# Patient Record
Sex: Female | Born: 1995 | Race: Black or African American | Hispanic: No | Marital: Single | Smoking: Former smoker
Health system: Southern US, Community
[De-identification: ages and names within clinical notes are randomized; demographics above are authoritative.]

## PROBLEM LIST (undated history)

## (undated) DIAGNOSIS — J45909 Unspecified asthma, uncomplicated: Secondary | ICD-10-CM

---

## 2020-01-10 ENCOUNTER — Emergency Department
Admission: EM | Admit: 2020-01-10 | Discharge: 2020-01-10 | Disposition: A | Discharge status: Home / Self Care | Payer: No Typology Code available for payment source | Attending: Emergency Medicine | Admitting: Emergency Medicine

## 2020-01-10 ENCOUNTER — Emergency Department: Payer: No Typology Code available for payment source

## 2020-01-10 ENCOUNTER — Other Ambulatory Visit: Payer: Self-pay

## 2020-01-10 DIAGNOSIS — S0990XA Unspecified injury of head, initial encounter: Secondary | ICD-10-CM | POA: Diagnosis not present

## 2020-01-10 DIAGNOSIS — S199XXA Unspecified injury of neck, initial encounter: Secondary | ICD-10-CM | POA: Insufficient documentation

## 2020-01-10 DIAGNOSIS — Y9389 Activity, other specified: Secondary | ICD-10-CM | POA: Diagnosis not present

## 2020-01-10 DIAGNOSIS — J45909 Unspecified asthma, uncomplicated: Secondary | ICD-10-CM | POA: Insufficient documentation

## 2020-01-10 DIAGNOSIS — F17210 Nicotine dependence, cigarettes, uncomplicated: Secondary | ICD-10-CM | POA: Diagnosis not present

## 2020-01-10 DIAGNOSIS — Y999 Unspecified external cause status: Secondary | ICD-10-CM | POA: Insufficient documentation

## 2020-01-10 DIAGNOSIS — Y92410 Unspecified street and highway as the place of occurrence of the external cause: Secondary | ICD-10-CM | POA: Diagnosis not present

## 2020-01-10 HISTORY — DX: Unspecified asthma, uncomplicated: J45.909

## 2020-01-10 MED ORDER — METHOCARBAMOL 500 MG PO TABS: 500.0000 mg | ORAL_TABLET | Freq: Three times a day (TID) | ORAL | 0 refills | Status: AC | PRN

## 2020-01-10 MED ORDER — MELOXICAM 15 MG PO TABS: 15.0000 mg | ORAL_TABLET | Freq: Every day | ORAL | 1 refills | Status: AC
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
## 2020-12-24 ENCOUNTER — Encounter (HOSPITAL_COMMUNITY): Payer: Self-pay

## 2020-12-24 ENCOUNTER — Ambulatory Visit (HOSPITAL_COMMUNITY)
Admission: EM | Admit: 2020-12-24 | Discharge: 2020-12-24 | Disposition: A | Discharge status: Home / Self Care | Payer: BC Managed Care – PPO | Attending: Family Medicine | Admitting: Family Medicine

## 2020-12-24 ENCOUNTER — Other Ambulatory Visit: Payer: Self-pay

## 2020-12-24 DIAGNOSIS — J3089 Other allergic rhinitis: Secondary | ICD-10-CM

## 2020-12-24 DIAGNOSIS — J4521 Mild intermittent asthma with (acute) exacerbation: Secondary | ICD-10-CM | POA: Diagnosis not present

## 2020-12-24 MED ORDER — PROMETHAZINE-DM 6.25-15 MG/5ML PO SYRP
5.0000 mL | ORAL_SOLUTION | Freq: Four times a day (QID) | ORAL | 0 refills | Status: DC | PRN
Start: 2020-12-24 — End: 2022-01-09

## 2020-12-24 MED ORDER — PREDNISONE 20 MG PO TABS
40.0000 mg | ORAL_TABLET | Freq: Every day | ORAL | 0 refills | Status: DC
Start: 2020-12-24 — End: 2022-01-09

## 2020-12-24 MED ORDER — ALBUTEROL SULFATE HFA 108 (90 BASE) MCG/ACT IN AERS: 1.0000 | INHALATION_SPRAY | Freq: Four times a day (QID) | RESPIRATORY_TRACT | 0 refills | Status: DC | PRN

## 2020-12-24 NOTE — ED Triage Notes (Signed)
Pt c/o breathing problems and cough x 1 week.   States it has been getting worse and Mucinex has not given any relief along.   She states she has an inhaler at home that has not giver her relief either.

## 2020-12-24 NOTE — ED Provider Notes (Signed)
[facility]    CSN: [id number] Arrival date & time: 12/24/20  1020      History   Chief Complaint Chief Complaint  Patient presents with   Cough    HPI Jasmine Banks is a 25 y.o. female.   Patient presenting today with 1 week history of cough, wheezing, chest tightness, congestion.  States history of seasonal allergies and asthma, sometimes having flares such as this but usually able to get control of them with Mucinex, NyQuil, albuterol inhaler which she has at home.  Not getting any relief from these things at this time.  Denies fever, chills, chest pain, abdominal pain, nausea vomiting diarrhea, sick contacts.   Past Medical History:  Diagnosis Date   Asthma     There are no problems to display for this patient.   History reviewed. No pertinent surgical history.  OB History   No obstetric history on file.      Home Medications    Prior to Admission medications   Medication Sig Start Date End Date Taking? Authorizing Provider  albuterol (VENTOLIN HFA) 108 (90 Base) MCG/ACT inhaler Inhale 1-2 puffs into the lungs every 6 (six) hours as needed for wheezing or shortness of breath. 12/24/20  Yes Particia Nearing, PA-C  predniSONE (DELTASONE) 20 MG tablet Take 2 tablets (40 mg total) by mouth daily with breakfast. 12/24/20  Yes Particia Nearing, PA-C  promethazine-dextromethorphan (PROMETHAZINE-DM) 6.25-15 MG/5ML syrup Take 5 mLs by mouth 4 (four) times daily as needed for cough. 12/24/20  Yes Particia Nearing, PA-C    Family History Family History  Family history unknown: Yes    Social History Social History   Tobacco Use   Smoking status: Some Days    Pack years: 0.00   Smokeless tobacco: Never  Vaping Use   Vaping Use: Some days     Allergies   Influenza virus vaccine   Review of Systems Review of Systems Per HPI  Physical Exam Triage Vital Signs ED Triage Vitals  Enc Vitals Group     BP 12/24/20 1123 (!)  143/83     Pulse Rate 12/24/20 1123 76     Resp 12/24/20 1123 17     Temp 12/24/20 1123 99.5 F (37.5 C)     Temp Source 12/24/20 1123 Oral     SpO2 12/24/20 1123 100 %     Weight --      Height --      Head Circumference --      Peak Flow --      Pain Score 12/24/20 1121 2     Pain Loc --      Pain Edu? --      Excl. in GC? --    No data found.  Updated Vital Signs BP (!) 143/83 (BP Location: Right Arm)   Pulse 76   Temp 99.5 F (37.5 C) (Oral)   Resp 17   LMP  (LMP Unknown)   SpO2 100%   Visual Acuity Right Eye Distance:   Left Eye Distance:   Bilateral Distance:    Right Eye Near:   Left Eye Near:    Bilateral Near:     Physical Exam Vitals and nursing note reviewed.  Constitutional:      Appearance: Normal appearance. She is not ill-appearing.  HENT:     Head: Atraumatic.     Right Ear: Tympanic membrane normal.     Left Ear: Tympanic membrane normal.     Nose: Rhinorrhea  present.     Mouth/Throat:     Mouth: Mucous membranes are moist.     Pharynx: Oropharynx is clear. Posterior oropharyngeal erythema present.  Eyes:     Extraocular Movements: Extraocular movements intact.     Conjunctiva/sclera: Conjunctivae normal.  Cardiovascular:     Rate and Rhythm: Normal rate and regular rhythm.     Heart sounds: Normal heart sounds.  Pulmonary:     Effort: Pulmonary effort is normal.     Breath sounds: Wheezing present. No rales.     Comments: Minimal scattered wheezes bilaterally Abdominal:     General: Bowel sounds are normal. There is no distension.     Palpations: Abdomen is soft.     Tenderness: There is no abdominal tenderness. There is no guarding.  Musculoskeletal:        General: Normal range of motion.     Cervical back: Normal range of motion and neck supple.  Skin:    General: Skin is warm and dry.  Neurological:     Mental Status: She is alert and oriented to person, place, and time.  Psychiatric:        Mood and Affect: Mood normal.         Thought Content: Thought content normal.        Judgment: Judgment normal.     UC Treatments / Results  Labs (all labs ordered are listed, but only abnormal results are displayed) Labs Reviewed - No data to display  EKG   Radiology No results found.  Procedures Procedures (including critical care time)  Medications Ordered in UC Medications - No data to display  Initial Impression / Assessment and Plan / UC Course  I have reviewed the triage vital signs and the nursing notes.  Pertinent labs & imaging results that were available during my care of the patient were reviewed by me and considered in my medical decision making (see chart for details).     Exacerbation of asthma and seasonal allergies, treat with prednisone burst, Phenergan DM, albuterol inhaler for as needed use.  Continue over-the-counter medications and supportive home measures.  Return for acutely worsening symptoms.  Work note given. Final Clinical Impressions(s) / UC Diagnoses   Final diagnoses:  Mild intermittent asthma with exacerbation  Seasonal allergic rhinitis due to other allergic trigger   Discharge Instructions   None    ED Prescriptions     Medication Sig Dispense Auth. Provider   predniSONE (DELTASONE) 20 MG tablet Take 2 tablets (40 mg total) by mouth daily with breakfast. 10 tablet Particia Nearing, PA-C   albuterol (VENTOLIN HFA) 108 (90 Base) MCG/ACT inhaler Inhale 1-2 puffs into the lungs every 6 (six) hours as needed for wheezing or shortness of breath. 18 g Roosvelt Maser [place], [place]   promethazine-dextromethorphan (PROMETHAZINE-DM) 6.25-15 MG/5ML syrup Take 5 mLs by mouth 4 (four) times daily as needed for cough. 100 mL Particia Nearing, [place]      PDMP not reviewed this encounter.   Particia Nearing, [place] 12/24/20 1153

## 2021-08-29 ENCOUNTER — Emergency Department (HOSPITAL_BASED_OUTPATIENT_CLINIC_OR_DEPARTMENT_OTHER)
Admission: EM | Admit: 2021-08-29 | Discharge: 2021-08-29 | Disposition: A | Discharge status: Home / Self Care | Payer: BC Managed Care – PPO | Attending: Emergency Medicine | Admitting: Emergency Medicine

## 2021-08-29 ENCOUNTER — Encounter (HOSPITAL_BASED_OUTPATIENT_CLINIC_OR_DEPARTMENT_OTHER): Payer: Self-pay | Admitting: *Deleted

## 2021-08-29 ENCOUNTER — Other Ambulatory Visit: Payer: Self-pay

## 2021-08-29 DIAGNOSIS — R1031 Right lower quadrant pain: Secondary | ICD-10-CM | POA: Insufficient documentation

## 2021-08-29 DIAGNOSIS — R197 Diarrhea, unspecified: Secondary | ICD-10-CM | POA: Insufficient documentation

## 2021-08-29 DIAGNOSIS — R1032 Left lower quadrant pain: Secondary | ICD-10-CM | POA: Insufficient documentation

## 2021-08-29 LAB — CBC
HCT: 47.3 % — ABNORMAL HIGH (ref 36.0–46.0)
Hemoglobin: 16.4 g/dL — ABNORMAL HIGH (ref 12.0–15.0)
MCH: 30.9 pg (ref 26.0–34.0)
MCHC: 34.7 g/dL (ref 30.0–36.0)
MCV: 89.2 fL (ref 80.0–100.0)
Platelets: 200 10*3/uL (ref 150–400)
RBC: 5.3 MIL/uL — ABNORMAL HIGH (ref 3.87–5.11)
RDW: 12.6 % (ref 11.5–15.5)
WBC: 5.5 10*3/uL (ref 4.0–10.5)
nRBC: 0 % (ref 0.0–0.2)

## 2021-08-29 LAB — URINALYSIS, MICROSCOPIC (REFLEX): WBC, UA: NONE SEEN WBC/hpf (ref 0–5)

## 2021-08-29 LAB — COMPREHENSIVE METABOLIC PANEL
ALT: 18 U/L (ref 0–44)
AST: 19 U/L (ref 15–41)
Albumin: 4.2 g/dL (ref 3.5–5.0)
Alkaline Phosphatase: 60 U/L (ref 38–126)
Anion gap: 9 (ref 5–15)
BUN: 8 mg/dL (ref 6–20)
CO2: 22 mmol/L (ref 22–32)
Calcium: 9.6 mg/dL (ref 8.9–10.3)
Chloride: 105 mmol/L (ref 98–111)
Creatinine, Ser: 0.93 mg/dL (ref 0.44–1.00)
GFR, Estimated: >60 mL/min (ref >60)
Glucose, Bld: 99 mg/dL (ref 70–99)
Potassium: 3.7 mmol/L (ref 3.5–5.1)
Sodium: 136 mmol/L (ref 135–145)
Total Bilirubin: 0.5 mg/dL (ref 0.3–1.2)
Total Protein: 8.1 g/dL (ref 6.5–8.1)

## 2021-08-29 LAB — URINALYSIS, ROUTINE W REFLEX MICROSCOPIC
Bilirubin Urine: NEGATIVE
Glucose, UA: NEGATIVE mg/dL
Ketones, ur: NEGATIVE mg/dL
Leukocytes,Ua: NEGATIVE
Nitrite: NEGATIVE
Protein, ur: NEGATIVE mg/dL
Specific Gravity, Urine: >=1.03 (ref 1.005–1.030)
pH: 5.5 (ref 5.0–8.0)

## 2021-08-29 LAB — PREGNANCY, URINE: Preg Test, Ur: NEGATIVE

## 2021-08-29 MED ORDER — HYDROMORPHONE HCL 1 MG/ML IJ SOLN
0.5000 mg | Freq: Once | INTRAMUSCULAR | Status: AC
  Administered 2021-08-29: 0.5 mg via INTRAVENOUS
  Filled 2021-08-29: qty 1

## 2021-08-29 MED ORDER — SODIUM CHLORIDE 0.9 % IV BOLUS
1000.0000 mL | Freq: Once | INTRAVENOUS | Status: AC
  Administered 2021-08-29: 1000 mL via INTRAVENOUS

## 2021-08-29 MED ORDER — ONDANSETRON HCL 4 MG/2ML IJ SOLN
4.0000 mg | Freq: Once | INTRAMUSCULAR | Status: AC
  Administered 2021-08-29: 4 mg via INTRAVENOUS
  Filled 2021-08-29: qty 2

## 2021-08-29 NOTE — Discharge Instructions (Addendum)
It was our pleasure to provide your ER care today - we hope that you feel better. ? ?Drink plenty of fluids/stay well hydrated.  ? ?Follow up with primary care doctor in 1-2 days if symptoms fail to improve/resolve. ? ?Return to ER if worse, new symptoms, high fevers, new, worsening or severe abdominal pain, persistent vomiting, or other concern. ? ?You were given pain meds in the ER - no driving for the next 6 hours. ?

## 2021-08-29 NOTE — ED Notes (Signed)
States has abd pain, at RLQ and LLQ, has diarrhea with this as well, stated it started yesterday. Denies fevers, some nausea, no vomiting. States her pain is currently at a 2 on a 0-10 scale at this time. Informed that urine spec is requested per the ED MD ?

## 2021-08-29 NOTE — ED Notes (Signed)
Client is currently NPO until further orders rec ?

## 2021-08-29 NOTE — ED Triage Notes (Signed)
Lower abdominal pain. Diarrhea. Denies vaginal discharge.  ?

## 2021-08-29 NOTE — ED Notes (Signed)
PO fluids provided, states she feels much better after meds rec  ?

## 2021-08-29 NOTE — ED Provider Notes (Addendum)
?[facility] EMERGENCY DEPARTMENT ?Provider Note ? ? ?CSN: [id number] ?Arrival date & time: 08/29/21  1241 ? ?  ? ?History ? ?Chief Complaint  ?Patient presents with  ? Abdominal Pain  ? ? ?Jasmine Banks is a 26 y.o. female. ? ?Patient c/o lower abd pain and diarrhea in the past day. States several episodes diarrhea, loose to watery, stools in past day, mild dull lower abd pain/cramping. No abd distensions. No vomiting. ?mild nausea. No dysuria or hematuria. No abnormal vaginal discharge or bleeding. Last period ~ 3-4 weeks ago. No known ill contacts, recent travel, bad food ingestion or recent antibiotic use. Also wants 'hemorrhoids checked' as concerned recent diarrhea has caused some anal area discomfort. No melena or rectal bleeding. No fever or chills. No faintness.  ? ?The history is provided by the patient, medical records and a friend.  ?Abdominal Pain ?Associated symptoms: diarrhea   ?Associated symptoms: no chest pain, no cough, no dysuria, no fever, no shortness of breath, no sore throat, no vaginal bleeding, no vaginal discharge and no vomiting   ? ?  ? ?Home Medications ?Prior to Admission medications   ?Medication Sig Start Date End Date Taking? Authorizing Provider  ?albuterol (VENTOLIN HFA) 108 (90 Base) MCG/ACT inhaler Inhale 1-2 puffs into the lungs every 6 (six) hours as needed for wheezing or shortness of breath. 12/24/20   Particia Nearing, PA-C  ?predniSONE (DELTASONE) 20 MG tablet Take 2 tablets (40 mg total) by mouth daily with breakfast. 12/24/20   Particia Nearing, PA-C  ?promethazine-dextromethorphan (PROMETHAZINE-DM) 6.25-15 MG/5ML syrup Take 5 mLs by mouth 4 (four) times daily as needed for cough. 12/24/20   Particia Nearing, PA-C  ?   ? ?Allergies    ?Influenza virus vaccine   ? ?Review of Systems   ?Review of Systems  ?Constitutional:  Negative for fever.  ?HENT:  Negative for sore throat.   ?Eyes:  Negative for redness.  ?Respiratory:  Negative for cough  and shortness of breath.   ?Cardiovascular:  Negative for chest pain.  ?Gastrointestinal:  Positive for abdominal pain and diarrhea. Negative for vomiting.  ?Genitourinary:  Negative for dysuria, flank pain, vaginal bleeding and vaginal discharge.  ?Musculoskeletal:  Negative for back pain.  ?Skin:  Negative for rash.  ?Neurological:  Negative for light-headedness.  ?Hematological:  Does not bruise/bleed easily.  ?Psychiatric/Behavioral:  Negative for confusion.   ? ?Physical Exam ?Updated Vital Signs ?BP 120/68   Pulse 65   Temp 98.1 ?F (36.7 ?C) (Oral)   Resp 16   Ht 1.803 m (5\' 11" )   Wt 88.9 kg   LMP 08/04/2021   SpO2 99%   BMI 27.33 kg/m?  ?Physical Exam ?Vitals and nursing note reviewed.  ?Constitutional:   ?   Appearance: Normal appearance. She is well-developed.  ?HENT:  ?   Head: Atraumatic.  ?   Nose: Nose normal.  ?   Mouth/Throat:  ?   Mouth: Mucous membranes are moist.  ?Eyes:  ?   General: No scleral icterus. ?   Conjunctiva/sclera: Conjunctivae normal.  ?Neck:  ?   Trachea: No tracheal deviation.  ?Cardiovascular:  ?   Rate and Rhythm: Normal rate and regular rhythm.  ?   Pulses: Normal pulses.  ?   Heart sounds: Normal heart sounds. No murmur heard. ?  No friction rub. No gallop.  ?Pulmonary:  ?   Effort: Pulmonary effort is normal. No respiratory distress.  ?   Breath sounds: Normal breath sounds.  ?Abdominal:  ?  General: Bowel sounds are normal. There is no distension.  ?   Palpations: Abdomen is soft. There is no mass.  ?   Tenderness: There is no abdominal tenderness. There is no guarding or rebound.  ?   Hernia: No hernia is present.  ?Genitourinary: ?   Comments: No cva tenderness. Chaperoned exam w female  ed tech - no external hemorrhoids or mass, no abscess.  ?Musculoskeletal:     ?   General: No swelling.  ?   Cervical back: Neck supple. No muscular tenderness.  ?Skin: ?   General: Skin is warm and dry.  ?   Findings: No rash.  ?Neurological:  ?   Mental Status: She is alert.  ?    Comments: Alert, speech normal.   ?Psychiatric:     ?   Mood and Affect: Mood normal.  ? ? ?ED Results / Procedures / Treatments   ?Labs ?(all labs ordered are listed, but only abnormal results are displayed) ?Results for orders placed or performed during the hospital encounter of 08/29/21  ?Urinalysis, Routine w reflex microscopic Urine, Clean Catch  ?Result Value Ref Range  ? Color, Urine YELLOW YELLOW  ? APPearance CLEAR CLEAR  ? Specific Gravity, Urine >=1.030 1.005 - 1.030  ? pH 5.5 5.0 - 8.0  ? Glucose, UA NEGATIVE NEGATIVE mg/dL  ? Hgb urine dipstick SMALL (A) NEGATIVE  ? Bilirubin Urine NEGATIVE NEGATIVE  ? Ketones, ur NEGATIVE NEGATIVE mg/dL  ? Protein, ur NEGATIVE NEGATIVE mg/dL  ? Nitrite NEGATIVE NEGATIVE  ? Leukocytes,Ua NEGATIVE NEGATIVE  ?Pregnancy, urine  ?Result Value Ref Range  ? Preg Test, Ur NEGATIVE NEGATIVE  ?CBC  ?Result Value Ref Range  ? WBC 5.5 4.0 - 10.5 K/uL  ? RBC 5.30 (H) 3.87 - 5.11 MIL/uL  ? Hemoglobin 16.4 (H) 12.0 - 15.0 g/dL  ? HCT 47.3 (H) 36.0 - 46.0 %  ? MCV 89.2 80.0 - 100.0 fL  ? MCH 30.9 26.0 - 34.0 pg  ? MCHC 34.7 30.0 - 36.0 g/dL  ? RDW 12.6 11.5 - 15.5 %  ? Platelets 200 150 - 400 K/uL  ? nRBC 0.0 0.0 - 0.2 %  ?Comprehensive metabolic panel  ?Result Value Ref Range  ? Sodium 136 135 - 145 mmol/L  ? Potassium 3.7 3.5 - 5.1 mmol/L  ? Chloride 105 98 - 111 mmol/L  ? CO2 22 22 - 32 mmol/L  ? Glucose, Bld 99 70 - 99 mg/dL  ? BUN 8 6 - 20 mg/dL  ? Creatinine, Ser 0.93 0.44 - 1.00 mg/dL  ? Calcium 9.6 8.9 - 10.3 mg/dL  ? Total Protein 8.1 6.5 - 8.1 g/dL  ? Albumin 4.2 3.5 - 5.0 g/dL  ? AST 19 15 - 41 U/L  ? ALT 18 0 - 44 U/L  ? Alkaline Phosphatase 60 38 - 126 U/L  ? Total Bilirubin 0.5 0.3 - 1.2 mg/dL  ? GFR, Estimated >60 >60 mL/min  ? Anion gap 9 5 - 15  ?Urinalysis, Microscopic (reflex)  ?Result Value Ref Range  ? RBC / HPF 6-10 0 - 5 RBC/hpf  ? WBC, UA NONE SEEN 0 - 5 WBC/hpf  ? Bacteria, UA RARE (A) NONE SEEN  ? Squamous Epithelial / LPF 0-5 0 - 5   ? ? ? ?EKG ?None ? ?Radiology ?No results found. ? ?Procedures ?Procedures  ? ? ?Medications Ordered in ED ?Medications  ?sodium chloride 0.9 % bolus 1,000 mL (0 mLs Intravenous Stopped 08/29/21 1443)  ?HYDROmorphone (DILAUDID)  injection 0.5 mg (0.5 mg Intravenous Given 08/29/21 1343)  ?ondansetron (ZOFRAN) injection 4 mg (4 mg Intravenous Given 08/29/21 1343)  ? ? ?ED Course/ Medical Decision Making/ A&P ?  ?                        ?Medical Decision Making ?Problems Addressed: ?Acute diarrhea: acute illness or injury with systemic symptoms ?Bilateral lower abdominal cramping: acute illness or injury ? ?Amount and/or Complexity of Data Reviewed ?Independent Historian: friend ?   Details: additional hx ?External Data Reviewed: notes. ?Labs: ordered. Decision-making details documented in ED Course. ? ?Risk ?Prescription drug management. ? ? ?Iv ns bolus. Dilaudid iv. Zofran iv. Labs sent.  ? ?Reviewed nursing notes and prior charts for additional history.  ?External reports reviewed. ? ?Labs reviewed/interpreted by me - chem normal. Wbc normal. Ua and preg neg.  ? ?Po fluids.  ? ?Pt tolerating po, no recurrent diarrhea while in ED.  ? ?Abd is soft and non tender. ? ?Pt currently appears stable for d/c. ? ?Return precautions provided. ? ? ? ? ? ? ? ? ? ?Final Clinical Impression(s) / ED Diagnoses ?Final diagnoses:  ?Acute diarrhea  ?Bilateral lower abdominal cramping  ? ? ?Rx / DC Orders ?ED Discharge Orders   ? ? None  ? ?  ? ? ?  ? ? ?  ?Cathren Laine, MD ?08/29/21 1522 ? ?

## 2021-08-29 NOTE — ED Notes (Signed)
Tolerating PO fluids well, up to restroom, gait steady, req min assistance ?

## 2021-08-29 NOTE — ED Notes (Signed)
Urine spec to lab 
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
## 2022-01-09 ENCOUNTER — Ambulatory Visit
Admission: EM | Admit: 2022-01-09 | Discharge: 2022-01-09 | Disposition: A | Discharge status: Home / Self Care | Payer: Self-pay | Attending: Emergency Medicine | Admitting: Emergency Medicine

## 2022-01-09 DIAGNOSIS — H65192 Other acute nonsuppurative otitis media, left ear: Secondary | ICD-10-CM

## 2022-01-09 DIAGNOSIS — J4541 Moderate persistent asthma with (acute) exacerbation: Secondary | ICD-10-CM

## 2022-01-09 MED ORDER — CEFDINIR 300 MG PO CAPS: 300.0000 mg | ORAL_CAPSULE | Freq: Two times a day (BID) | ORAL | 0 refills | Status: AC

## 2022-01-09 MED ORDER — FLUTICASONE-SALMETEROL 250-50 MCG/ACT IN AEPB: 1.0000 | INHALATION_SPRAY | Freq: Two times a day (BID) | RESPIRATORY_TRACT | 0 refills | Status: AC

## 2022-01-09 MED ORDER — FLUTICASONE PROPIONATE 50 MCG/ACT NA SUSP: 1.0000 | Freq: Every day | NASAL | 2 refills | Status: AC

## 2022-01-09 MED ORDER — METHYLPREDNISOLONE 4 MG PO TBPK: ORAL_TABLET | ORAL | 0 refills | Status: AC

## 2022-01-09 MED ORDER — MONTELUKAST SODIUM 10 MG PO TABS: 10.0000 mg | ORAL_TABLET | Freq: Every day | ORAL | 2 refills | Status: AC

## 2022-01-09 MED ORDER — PROMETHAZINE-DM 6.25-15 MG/5ML PO SYRP: 5.0000 mL | ORAL_SOLUTION | Freq: Four times a day (QID) | ORAL | 0 refills | Status: AC | PRN

## 2022-01-09 MED ORDER — CETIRIZINE HCL 10 MG PO TABS: 10.0000 mg | ORAL_TABLET | Freq: Every day | ORAL | 0 refills | Status: AC

## 2022-01-09 MED ORDER — ALBUTEROL SULFATE HFA 108 (90 BASE) MCG/ACT IN AERS: 2.0000 | INHALATION_SPRAY | Freq: Four times a day (QID) | RESPIRATORY_TRACT | 0 refills | Status: AC | PRN

## 2022-01-09 NOTE — Discharge Instructions (Addendum)
You have an infection in your left ear that requires treatment with antibiotics.  Your symptoms and my physical exam findings are also concerning for exacerbation of your underlying asthma and allergies may have caused you to acquire the ear infection.  It is important that you are consistent with taking your inhaled medications exactly as prescribed.      Please see the list below for recommended medications, dosages and frequencies to provide relief of your current symptoms:    Omnicef (cefdinir):  1 capsule twice daily for 10 days, you can take it with or without food.  This antibiotic can cause upset stomach, this will resolve once antibiotics are complete.  You are welcome to use a probiotic, eat yogurt, take Imodium while taking this medication.  Please avoid other systemic medications such as Maalox, Pepto-Bismol or milk of magnesia as they can interfere with your body's ability to absorb the antibiotics.  ProAir, Ventolin, Proventil (albuterol): This inhaled medication contains a short acting beta agonist bronchodilator.  This medication works on the smooth muscle that opens and constricts of your airways by relaxing the muscle.  The result of relaxation of the smooth muscle is increased air movement and improved work of breathing.  This is a short acting medication that can be used every 4-6 hours as needed for increased work of breathing, shortness of breath, wheezing and excessive coughing.     Advair (fluticasone and salmeterol): Please inhale 2 puffs twice daily.  This inhaled medication contains a corticosteroid and long-acting form of albuterol.  The inhaled steroid and this medication  is not absorbed into the body and will not cause side effects such as increased blood sugar levels, irritability, sleeplessness or weight gain.  Inhaled corticosteroid are sort of like topical steroid creams but, as you can imagine, it is not practical to attempt to rub a steroid cream inside of your lungs.   The long-acting albuterol works similarly to the short acting albuterol found in your rescue inhaler but provides 24-hour relaxation of the smooth muscles that open and constrict your airways; your short acting rescue inhaler can only provide for a few hours this benefit for a few hours.  Please feel free to continue using your short acting rescue inhaler as often as needed throughout the day for shortness of breath, wheezing, and cough.  Medrol Dosepak (methylprednisolone): This is a steroid that will significantly calm your upper and lower airways, please take one row of tablets daily with your breakfast meal starting tomorrow morning until the prescription is complete.      Zyrtec (cetirizine): This is an excellent second-generation antihistamine that helps to reduce respiratory inflammatory response to environmental allergens.  In some patients, this medication can cause daytime sleepiness so I recommend that you take 1 tablet daily at bedtime.     Singulair (montelukast): This is a mast cell stabilizer that works well with antihistamines.  Mast cells are responsible for stimulating histamine production so you can imagine that if we can reduce the activity of your mast cells, then fewer histamines will be produced and inflammation caused by allergy exposure will be significantly reduced.  I recommend that you take this medication at the same time you take your antihistamine.   Flonase (fluticasone): This is a steroid nasal spray that you use once daily, 1 spray in each nare.  This medication does not work well if you decide to use it only used as you feel you need to, it works best used on a daily  basis.  After 3 to 5 days of use, you will notice significant reduction of the inflammation and mucus production that is currently being caused by exposure to allergens, whether seasonal or environmental.  The most common side effect of this medication is nosebleeds.  If you experience a nosebleed, please  discontinue use for 1 week, then feel free to resume.  I have provided you with a prescription.     Promethazine DM: Promethazine is both a nasal decongestant and an antinausea medication that makes most patients feel fairly sleepy.  The DM is dextromethorphan, a cough suppressant found in many over-the-counter cough medications.  Please take 5 mL before bedtime to minimize your cough which will help you sleep better.  I have sent a prescription for this medication to your pharmacy. Please follow-up within the next 5-7 days either with your primary care provider or urgent care if your symptoms do not resolve.  If you do not have a primary care provider, we will assist you in finding one.   Thank you for visiting urgent care today.  We appreciate the opportunity to participate in your care.

## 2022-01-09 NOTE — ED Triage Notes (Signed)
Pt c/o she is having an asthma flair, vomiting and a cough that began Wednesday.   Home interventions: delsym, benadryl, mucinex

## 2022-01-09 NOTE — ED Provider Notes (Signed)
[facility]    CSN: [id number] Arrival date & time: 01/09/22  0930    HISTORY   Chief Complaint  Patient presents with   Emesis   Cough   HPI Jasmine Banks is a pleasant, 26 y.o. female who presents to urgent care today complaining of Asthma exacerbation.  Patient states that 5 days ago she began to have cough, nasal congestion, nasal drainage.  Patient states cough is nonproductive, worse at night.  Patient reports a history of asthma, previously on maintenance inhalers which she has been unable to afford due to not having insurance right now.  Patient states in the past 24 hours she has used her albuterol inhaler 8 times.  Patient states she has been taking Delsym Benadryl and Mucinex for symptoms with no relief.  The history is provided by the patient.   Past Medical History:  Diagnosis Date   Asthma    There are no problems to display for this patient.  History reviewed. No pertinent surgical history. OB History   No obstetric history on file.    Home Medications    Prior to Admission medications   Medication Sig Start Date End Date Taking? Authorizing Provider  albuterol (VENTOLIN HFA) 108 (90 Base) MCG/ACT inhaler Inhale 1-2 puffs into the lungs every 6 (six) hours as needed for wheezing or shortness of breath. 12/24/20   Particia NearingLane, Rachel Elizabeth, PA-C  predniSONE (DELTASONE) 20 MG tablet Take 2 tablets (40 mg total) by mouth daily with breakfast. 12/24/20   Particia NearingLane, Rachel Elizabeth, PA-C  promethazine-dextromethorphan (PROMETHAZINE-DM) 6.25-15 MG/5ML syrup Take 5 mLs by mouth 4 (four) times daily as needed for cough. 12/24/20   Particia NearingLane, Rachel Elizabeth, PA-C    Family History Family History  Family history unknown: Yes   Social History Social History   Tobacco Use   Smoking status: Former    Types: Cigarettes   Smokeless tobacco: Never  Vaping Use   Vaping Use: Former  Substance Use Topics   Alcohol use: Yes   Drug use: Yes    Types: Marijuana    Allergies   Peanut-containing drug products and Influenza virus vaccine  Review of Systems Review of Systems Pertinent findings revealed after performing a 14 point review of systems has been noted in the history of present illness.  Physical Exam Triage Vital Signs ED Triage Vitals  Enc Vitals Group     BP 04/25/21 0827 (!) 147/82     Pulse Rate 04/25/21 0827 72     Resp 04/25/21 0827 18     Temp 04/25/21 0827 98.3 F (36.8 C)     Temp Source 04/25/21 0827 Oral     SpO2 04/25/21 0827 98 %     Weight --      Height --      Head Circumference --      Peak Flow --      Pain Score 04/25/21 0826 5     Pain Loc --      Pain Edu? --      Excl. in GC? --   No data found.  Updated Vital Signs BP (!) 158/98 (BP Location: Left Arm)   Pulse 90   Temp 98.9 F (37.2 C) (Oral)   Resp 18   LMP 12/30/2021   SpO2 97%   Physical Exam Vitals and nursing note reviewed.  Constitutional:      General: She is not in acute distress.    Appearance: Normal appearance. She is not ill-appearing.  HENT:  Head: Normocephalic and atraumatic.     Salivary Glands: Right salivary gland is not diffusely enlarged or tender. Left salivary gland is not diffusely enlarged or tender.     Right Ear: Ear canal and external ear normal. No drainage. No middle ear effusion. There is no impacted cerumen. Tympanic membrane is bulging. Tympanic membrane is not injected, erythematous or retracted.     Left Ear: Ear canal and external ear normal. No drainage.  No middle ear effusion. There is no impacted cerumen. Tympanic membrane is injected, erythematous and retracted. Tympanic membrane is not bulging.     Nose: Rhinorrhea present. No nasal deformity, septal deviation, signs of injury, nasal tenderness, mucosal edema or congestion. Rhinorrhea is clear.     Right Nostril: Occlusion present. No foreign body, epistaxis or septal hematoma.     Left Nostril: Occlusion present. No foreign body, epistaxis or septal  hematoma.     Right Turbinates: Enlarged, swollen and pale.     Left Turbinates: Enlarged, swollen and pale.     Right Sinus: No maxillary sinus tenderness or frontal sinus tenderness.     Left Sinus: No maxillary sinus tenderness or frontal sinus tenderness.     Mouth/Throat:     Lips: Pink. No lesions.     Mouth: Mucous membranes are moist. No oral lesions.     Pharynx: Oropharynx is clear. Uvula midline. No posterior oropharyngeal erythema or uvula swelling.     Tonsils: No tonsillar exudate. 0 on the right. 0 on the left.     Comments: Postnasal drip Eyes:     General: Lids are normal.        Right eye: No discharge.        Left eye: No discharge.     Extraocular Movements: Extraocular movements intact.     Conjunctiva/sclera: Conjunctivae normal.     Right eye: Right conjunctiva is not injected.     Left eye: Left conjunctiva is not injected.  Neck:     Trachea: Trachea and phonation normal.  Cardiovascular:     Rate and Rhythm: Normal rate and regular rhythm.     Pulses: Normal pulses.     Heart sounds: Normal heart sounds. No murmur heard.    No friction rub. No gallop.  Pulmonary:     Effort: Pulmonary effort is normal. No tachypnea, bradypnea, accessory muscle usage, prolonged expiration, respiratory distress or retractions.     Breath sounds: Normal air entry. No stridor, decreased air movement or transmitted upper airway sounds. Examination of the right-lower field reveals wheezing. Examination of the left-lower field reveals wheezing. Wheezing present. No decreased breath sounds, rhonchi or rales.  Chest:     Chest wall: No tenderness.  Musculoskeletal:        General: Normal range of motion.     Cervical back: Normal range of motion and neck supple. Normal range of motion.  Lymphadenopathy:     Cervical: Cervical adenopathy present.     Right cervical: Superficial cervical adenopathy and posterior cervical adenopathy present.     Left cervical: Superficial cervical  adenopathy and posterior cervical adenopathy present.  Skin:    General: Skin is warm and dry.     Findings: No erythema or rash.  Neurological:     General: No focal deficit present.     Mental Status: She is alert and oriented to person, place, and time.  Psychiatric:        Mood and Affect: Mood normal.  Behavior: Behavior normal.     Visual Acuity Right Eye Distance:   Left Eye Distance:   Bilateral Distance:    Right Eye Near:   Left Eye Near:    Bilateral Near:     UC Couse / Diagnostics / Procedures:     Radiology No results found.  Procedures Procedures (including critical care time) EKG  Pending results:  Labs Reviewed - No data to display  Medications Ordered in UC: Medications - No data to display  UC Diagnoses / Final Clinical Impressions(s)   I have reviewed the triage vital signs and the nursing notes.  Pertinent labs & imaging results that were available during my care of the patient were reviewed by me and considered in my medical decision making (see chart for details).    Final diagnoses:  Moderate persistent asthma with exacerbation  Acute non-suppurative otitis media, left   Patient provided with good Rx coupons for her medications today.  I renewed her albuterol, Advair, Zyrtec and Flonase.  Have also advised her to take Singulair steroid Dosepak.  Patient also advised to begin cefdinir for bacterial infection in her left ear.  Return precautions advised.  ED Prescriptions     Medication Sig Dispense Auth. Provider   fluticasone-salmeterol (ADVAIR DISKUS) 250-50 MCG/ACT AEPB Inhale 1 puff into the lungs in the morning and at bedtime. 60 each Theadora Rama Scales, PA-C   albuterol (VENTOLIN HFA) 108 (90 Base) MCG/ACT inhaler Inhale 2 puffs into the lungs every 6 (six) hours as needed for wheezing or shortness of breath (Cough). 18 g Theadora Rama Scales, PA-C   cetirizine (ZYRTEC ALLERGY) 10 MG tablet Take 1 tablet (10 mg total) by  mouth at bedtime. 180 tablet Theadora Rama Scales, PA-C   fluticasone (FLONASE) 50 MCG/ACT nasal spray Place 1 spray into both nostrils daily. Begin by using 2 sprays in each nare daily for 3 to 5 days, then decrease to 1 spray in each nare daily. 15.8 mL Theadora Rama Scales, PA-C   cefdinir (OMNICEF) 300 MG capsule Take 1 capsule (300 mg total) by mouth 2 (two) times daily for 10 days. 20 capsule Theadora Rama Scales, PA-C   methylPREDNISolone (MEDROL DOSEPAK) 4 MG TBPK tablet Take 24 mg on day 1, 20 mg on day 2, 16 mg on day 3, 12 mg on day 4, 8 mg on day 5, 4 mg on day 6.  Take all tablets in each row at once, do not spread tablets out throughout the day. 21 tablet Theadora Rama Scales, PA-C   promethazine-dextromethorphan (PROMETHAZINE-DM) 6.25-15 MG/5ML syrup Take 5 mLs by mouth 4 (four) times daily as needed for cough. 118 mL Theadora Rama Scales, PA-C   montelukast (SINGULAIR) 10 MG tablet Take 1 tablet (10 mg total) by mouth at bedtime. 30 tablet Theadora Rama Scales, PA-C      PDMP not reviewed this encounter.  Disposition Upon Discharge:  Condition: stable for discharge home Home: take medications as prescribed; routine discharge instructions as discussed; follow up as advised.  Patient presented with an acute illness with associated systemic symptoms and significant discomfort requiring urgent management. In my opinion, this is a condition that a prudent lay person (someone who possesses an average knowledge of health and medicine) may potentially expect to result in complications if not addressed urgently such as respiratory distress, impairment of bodily function or dysfunction of bodily organs.   Routine symptom specific, illness specific and/or disease specific instructions were discussed with the patient and/or caregiver at length.  As such, the patient has been evaluated and assessed, work-up was performed and treatment was provided in alignment with urgent care  protocols and evidence based medicine.  Patient/parent/caregiver has been advised that the patient may require follow up for further testing and treatment if the symptoms continue in spite of treatment, as clinically indicated and appropriate.  If the patient was tested for COVID-19, Influenza and/or RSV, then the patient/parent/guardian was advised to isolate at home pending the results of his/her diagnostic coronavirus test and potentially longer if they're positive. I have also advised pt that if his/her COVID-19 test returns positive, it's recommended to self-isolate for at least 10 days after symptoms first appeared AND until fever-free for 24 hours without fever reducer AND other symptoms have improved or resolved. Discussed self-isolation recommendations as well as instructions for household member/close contacts as per the [facility] and [place] [facility], and also gave patient the COVID packet with this information.  Patient/parent/caregiver has been advised to return to the [facility] or PCP in 3-5 days if no better; to PCP or the Emergency Department if new signs and symptoms develop, or if the current signs or symptoms continue to change or worsen for further workup, evaluation and treatment as clinically indicated and appropriate  The patient will follow up with their current PCP if and as advised. If the patient does not currently have a PCP we will assist them in obtaining one.   The patient may need specialty follow up if the symptoms continue, in spite of conservative treatment and management, for further workup, evaluation, consultation and treatment as clinically indicated and appropriate.  Patient/parent/caregiver verbalized understanding and agreement of plan as discussed.  All questions were addressed during visit.  Please see discharge instructions below for further details of plan.  Discharge Instructions:   Discharge Instructions      You have an infection in your left ear that requires treatment  with antibiotics.  Your symptoms and my physical exam findings are also concerning for exacerbation of your underlying asthma and allergies may have caused you to acquire the ear infection.  It is important that you are consistent with taking your inhaled medications exactly as prescribed.      Please see the list below for recommended medications, dosages and frequencies to provide relief of your current symptoms:    Omnicef (cefdinir):  1 capsule twice daily for 10 days, you can take it with or without food.  This antibiotic can cause upset stomach, this will resolve once antibiotics are complete.  You are welcome to use a probiotic, eat yogurt, take Imodium while taking this medication.  Please avoid other systemic medications such as Maalox, Pepto-Bismol or milk of magnesia as they can interfere with your body's ability to absorb the antibiotics.  ProAir, Ventolin, Proventil (albuterol): This inhaled medication contains a short acting beta agonist bronchodilator.  This medication works on the smooth muscle that opens and constricts of your airways by relaxing the muscle.  The result of relaxation of the smooth muscle is increased air movement and improved work of breathing.  This is a short acting medication that can be used every 4-6 hours as needed for increased work of breathing, shortness of breath, wheezing and excessive coughing.     Advair (fluticasone and salmeterol): Please inhale 2 puffs twice daily.  This inhaled medication contains a corticosteroid and long-acting form of albuterol.  The inhaled steroid and this medication  is not absorbed into the body and will not cause side effects such  as increased blood sugar levels, irritability, sleeplessness or weight gain.  Inhaled corticosteroid are sort of like topical steroid creams but, as you can imagine, it is not practical to attempt to rub a steroid cream inside of your lungs.  The long-acting albuterol works similarly to the short acting  albuterol found in your rescue inhaler but provides 24-hour relaxation of the smooth muscles that open and constrict your airways; your short acting rescue inhaler can only provide for a few hours this benefit for a few hours.  Please feel free to continue using your short acting rescue inhaler as often as needed throughout the day for shortness of breath, wheezing, and cough.  Medrol Dosepak (methylprednisolone): This is a steroid that will significantly calm your upper and lower airways, please take one row of tablets daily with your breakfast meal starting tomorrow morning until the prescription is complete.      Zyrtec (cetirizine): This is an excellent second-generation antihistamine that helps to reduce respiratory inflammatory response to environmental allergens.  In some patients, this medication can cause daytime sleepiness so I recommend that you take 1 tablet daily at bedtime.     Singulair (montelukast): This is a mast cell stabilizer that works well with antihistamines.  Mast cells are responsible for stimulating histamine production so you can imagine that if we can reduce the activity of your mast cells, then fewer histamines will be produced and inflammation caused by allergy exposure will be significantly reduced.  I recommend that you take this medication at the same time you take your antihistamine.   Flonase (fluticasone): This is a steroid nasal spray that you use once daily, 1 spray in each nare.  This medication does not work well if you decide to use it only used as you feel you need to, it works best used on a daily basis.  After 3 to 5 days of use, you will notice significant reduction of the inflammation and mucus production that is currently being caused by exposure to allergens, whether seasonal or environmental.  The most common side effect of this medication is nosebleeds.  If you experience a nosebleed, please discontinue use for 1 week, then feel free to resume.  I have  provided you with a prescription.     Promethazine DM: Promethazine is both a nasal decongestant and an antinausea medication that makes most patients feel fairly sleepy.  The DM is dextromethorphan, a cough suppressant found in many over-the-counter cough medications.  Please take 5 mL before bedtime to minimize your cough which will help you sleep better.  I have sent a prescription for this medication to your pharmacy. Please follow-up within the next 5-7 days either with your primary care provider or urgent care if your symptoms do not resolve.  If you do not have a primary care provider, we will assist you in finding one.   Thank you for visiting urgent care today.  We appreciate the opportunity to participate in your care.       This office note has been dictated using Teaching laboratory technician.  Unfortunately, this method of dictation can sometimes lead to typographical or grammatical errors.  I apologize for your inconvenience in advance if this occurs.  Please do not hesitate to reach out to me if clarification is needed.      Theadora Rama Scales, PA-C 01/09/22 1227

## 2022-06-24 ENCOUNTER — Encounter (HOSPITAL_BASED_OUTPATIENT_CLINIC_OR_DEPARTMENT_OTHER): Payer: Self-pay | Admitting: Emergency Medicine

## 2022-06-24 ENCOUNTER — Emergency Department (HOSPITAL_BASED_OUTPATIENT_CLINIC_OR_DEPARTMENT_OTHER): Payer: Self-pay

## 2022-06-24 ENCOUNTER — Emergency Department (HOSPITAL_BASED_OUTPATIENT_CLINIC_OR_DEPARTMENT_OTHER)
Admission: EM | Admit: 2022-06-24 | Discharge: 2022-06-24 | Disposition: A | Discharge status: Home / Self Care | Payer: Self-pay | Attending: Emergency Medicine | Admitting: Emergency Medicine

## 2022-06-24 ENCOUNTER — Other Ambulatory Visit: Payer: Self-pay

## 2022-06-24 DIAGNOSIS — J4541 Moderate persistent asthma with (acute) exacerbation: Secondary | ICD-10-CM | POA: Insufficient documentation

## 2022-06-24 DIAGNOSIS — J111 Influenza due to unidentified influenza virus with other respiratory manifestations: Secondary | ICD-10-CM

## 2022-06-24 DIAGNOSIS — J101 Influenza due to other identified influenza virus with other respiratory manifestations: Secondary | ICD-10-CM | POA: Insufficient documentation

## 2022-06-24 DIAGNOSIS — Z7951 Long term (current) use of inhaled steroids: Secondary | ICD-10-CM | POA: Insufficient documentation

## 2022-06-24 DIAGNOSIS — Z20822 Contact with and (suspected) exposure to covid-19: Secondary | ICD-10-CM | POA: Insufficient documentation

## 2022-06-24 DIAGNOSIS — Z91010 Allergy to peanuts: Secondary | ICD-10-CM | POA: Insufficient documentation

## 2022-06-24 LAB — CBC
HCT: 47.1 % — ABNORMAL HIGH (ref 36.0–46.0)
Hemoglobin: 16 g/dL — ABNORMAL HIGH (ref 12.0–15.0)
MCH: 30.8 pg (ref 26.0–34.0)
MCHC: 34 g/dL (ref 30.0–36.0)
MCV: 90.8 fL (ref 80.0–100.0)
Platelets: 186 10*3/uL (ref 150–400)
RBC: 5.19 MIL/uL — ABNORMAL HIGH (ref 3.87–5.11)
RDW: 12.4 % (ref 11.5–15.5)
WBC: 6 10*3/uL (ref 4.0–10.5)
nRBC: 0 % (ref 0.0–0.2)

## 2022-06-24 LAB — RESP PANEL BY RT-PCR (RSV, FLU A&B, COVID)  RVPGX2
Influenza A by PCR: POSITIVE — AB
Influenza B by PCR: NEGATIVE
Resp Syncytial Virus by PCR: NEGATIVE
SARS Coronavirus 2 by RT PCR: NEGATIVE

## 2022-06-24 MED ORDER — ALBUTEROL SULFATE (2.5 MG/3ML) 0.083% IN NEBU
INHALATION_SOLUTION | RESPIRATORY_TRACT | Status: AC
  Administered 2022-06-24: 5 mg
  Filled 2022-06-24: qty 6

## 2022-06-24 MED ORDER — ACETAMINOPHEN 325 MG PO TABS
650.0000 mg | ORAL_TABLET | Freq: Once | ORAL | Status: AC
  Administered 2022-06-24: 650 mg via ORAL
  Filled 2022-06-24: qty 2

## 2022-06-24 MED ORDER — IPRATROPIUM-ALBUTEROL 0.5-2.5 (3) MG/3ML IN SOLN
3.0000 mL | Freq: Once | RESPIRATORY_TRACT | Status: AC
  Administered 2022-06-24: 3 mL via RESPIRATORY_TRACT
  Filled 2022-06-24: qty 3

## 2022-06-24 NOTE — ED Triage Notes (Signed)
Cough and sob x 4 days states has used all inhalers but she is not better

## 2022-06-24 NOTE — ED Provider Notes (Signed)
[facility] EMERGENCY DEPARTMENT Provider Note   CSN: [id number] Arrival date & time: 06/24/22  0840     History  Chief Complaint  Patient presents with   Cough    Jasmine Banks is a 26 y.o. female with past medical history significant for asthma, bronchitis who presents with concern for cough, shortness of breath, chest pain with deep breathing for 4 days.  Patient reports that she was exposed to upper respiratory symptoms from family member, who was seen and evaluated RDD sometime last week.  Family member was negative for COVID, flu, RSV at that time.  Patient reports Tmax 99.9.  Patient denies any nausea, vomiting, diarrhea, but does endorse some stomach cramping.  Patient reports that she has been frequently using home inhalers, last use last night.  Additionally with history of allergies, uses montelukast.  Patient denies any chest pain at rest or exertional chest pain.  Patient reports last steroid use for asthma exacerbation was around 6 months ago.   Cough Associated symptoms: shortness of breath        Home Medications Prior to Admission medications   Medication Sig Start Date End Date Taking? Authorizing Provider  albuterol (VENTOLIN HFA) 108 (90 Base) MCG/ACT inhaler Inhale 2 puffs into the lungs every 6 (six) hours as needed for wheezing or shortness of breath (Cough). 01/09/22   Theadora Rama Scales, PA-C  cetirizine (ZYRTEC ALLERGY) 10 MG tablet Take 1 tablet (10 mg total) by mouth at bedtime. 01/09/22 07/08/22  Theadora Rama Scales, PA-C  fluticasone (FLONASE) 50 MCG/ACT nasal spray Place 1 spray into both nostrils daily. Begin by using 2 sprays in each nare daily for 3 to 5 days, then decrease to 1 spray in each nare daily. 01/09/22   Theadora Rama Scales, PA-C  fluticasone-salmeterol (ADVAIR DISKUS) 250-50 MCG/ACT AEPB Inhale 1 puff into the lungs in the morning and at bedtime. 01/09/22 02/08/22  Theadora Rama Scales, PA-C  methylPREDNISolone  (MEDROL DOSEPAK) 4 MG TBPK tablet Take 24 mg on day 1, 20 mg on day 2, 16 mg on day 3, 12 mg on day 4, 8 mg on day 5, 4 mg on day 6.  Take all tablets in each row at once, do not spread tablets out throughout the day. 01/09/22   Theadora Rama Scales, PA-C  montelukast (SINGULAIR) 10 MG tablet Take 1 tablet (10 mg total) by mouth at bedtime. 01/09/22 04/09/22  Theadora Rama Scales, PA-C  promethazine-dextromethorphan (PROMETHAZINE-DM) 6.25-15 MG/5ML syrup Take 5 mLs by mouth 4 (four) times daily as needed for cough. 01/09/22   Theadora Rama Scales, PA-C      Allergies    Peanut-containing drug products and Influenza virus vaccine    Review of Systems   Review of Systems  Respiratory:  Positive for cough and shortness of breath.   All other systems reviewed and are negative.   Physical Exam Updated Vital Signs BP (!) 117/90   Pulse (!) 118   Temp 99.4 F (37.4 C) (Oral)   Resp (!) 21   Ht 5\' 11"  (1.803 m)   Wt 99.8 kg   LMP 06/10/2022 (Approximate)   SpO2 100%   BMI 30.68 kg/m  Physical Exam Vitals and nursing note reviewed.  Constitutional:      General: She is not in acute distress.    Appearance: Normal appearance.  HENT:     Head: Normocephalic and atraumatic.  Eyes:     General:        Right eye: No discharge.  Left eye: No discharge.  Cardiovascular:     Rate and Rhythm: Normal rate and regular rhythm.     Heart sounds: No murmur heard.    No friction rub. No gallop.  Pulmonary:     Effort: Pulmonary effort is normal.     Breath sounds: Normal breath sounds.     Comments: Patient with bilateral wheezing, scattered rhonchi, no focal consolidation noted.  Some increased respiratory effort but no acute respiratory distress. Abdominal:     General: Bowel sounds are normal.     Palpations: Abdomen is soft.  Skin:    General: Skin is warm and dry.     Capillary Refill: Capillary refill takes less than 2 seconds.  Neurological:     Mental Status: She is  alert and oriented to person, place, and time.  Psychiatric:        Mood and Affect: Mood normal.        Behavior: Behavior normal.     ED Results / Procedures / Treatments   Labs (all labs ordered are listed, but only abnormal results are displayed) Labs Reviewed  RESP PANEL BY RT-PCR (RSV, FLU A&B, COVID)  RVPGX2 - Abnormal; Notable for the following components:      Result Value   Influenza A by PCR POSITIVE (*)    All other components within normal limits  CBC - Abnormal; Notable for the following components:   RBC 5.19 (*)    Hemoglobin 16.0 (*)    HCT 47.1 (*)    All other components within normal limits  BASIC METABOLIC PANEL    EKG None  Radiology DG Chest 2 View  Result Date: 06/24/2022 CLINICAL DATA:  Nonproductive cough and short of breath 3 days. EXAM: CHEST - 2 VIEW COMPARISON:  None Available. FINDINGS: The heart size and mediastinal contours are within normal limits. Both lungs are clear. The visualized skeletal structures are unremarkable. IMPRESSION: No active cardiopulmonary disease. Electronically Signed   By: Marlan Palau M.D.   On: 06/24/2022 10:48    Procedures Procedures    Medications Ordered in ED Medications  ipratropium-albuterol (DUONEB) 0.5-2.5 (3) MG/3ML nebulizer solution 3 mL (3 mLs Nebulization Given 06/24/22 1023)  acetaminophen (TYLENOL) tablet 650 mg (650 mg Oral Given 06/24/22 0925)  albuterol (PROVENTIL) (2.5 MG/3ML) 0.083% nebulizer solution (5 mg  Given 06/24/22 4098)    ED Course/ Medical Decision Making/ A&P Clinical Course as of 06/24/22 1110  Wed Jun 24, 2022  1034 Flu+,  [CP]    Clinical Course User Index [CP] Olene Floss, PA-C                           Medical Decision Making Amount and/or Complexity of Data Reviewed Labs: ordered. Radiology: ordered.  Risk OTC drugs. Prescription drug management.   This patient is a 26 y.o. female  who presents to the ED for concern of shortness of breath, cough,  asthma worsening.   Differential diagnoses prior to evaluation: The emergent differential diagnosis includes, but is not limited to,  asthma exacerbation, COPD exacerbation, acute upper respiratory infection, acute bronchitis, chronic bronchitis, interstitial lung disease, ARDS, PE, pneumonia, atypical ACS, carbon monoxide poisoning, spontaneous pneumothorax versus other .   This is not an exhaustive differential.   Past Medical History / Co-morbidities: Asthma  Additional history: Chart reviewed. Pertinent results include: Reviewed lab work, imaging from previous emergency department visits, urgent care, patient with previous history of asthma with exacerbation needing  treatment  Physical Exam: Physical exam performed. The pertinent findings include: On arrival patient with inspiratory and expiratory wheezing, increased work of breathing but stable oxygen saturation on room air, after DuoNeb x 2, patient with very mild expiratory wheezing only at upper lung fields, improved in the lower lung fields  Lab Tests/Imaging studies: I personally interpreted labs/imaging and the pertinent results include: CBC overall unremarkable, hemoglobin mildly elevated may suggest some mild dehydration, however patient is tolerating p.o., and encouraged increased p.o. intake.  Her RVP is positive for flu, negative for COVID, RSV, I do think that flu explains patient's cough, and asthma exacerbation.BMP hemolyzed, however patient's symptoms seem consistent with Flu, low clinical suspicion for electrolyte abnormality. Think okay to forgo additional blood draw as patient is hard stick and no IV was able to be placed. I agree with the radiologist interpretation.  Cardiac monitoring: EKG obtained and interpreted by my attending physician which shows: Sinus tachycardia, likely secondary to subacute fever, mild dehydration, as well as frequent albuterol usage, minimally more tachycardic after DuoNeb, patient with no  significant risk factors for PE, current pregnancy, hormonal birth control use, leg swelling, hemoptysis very low probability of PE based on Wells score, I think okay to forego any additional testing at this time and reasonable alternative diagnosis of flu and asthma related symptoms, but return precautions given for worsening shortness of breath, this, leg swelling   Medications: I ordered medication including DuoNeb x 2, Tylenol.  I have reviewed the patients home medicines and have made adjustments as needed.  Patient discharged with prednisone for asthma exacerbation.   Disposition: After consideration of the diagnostic results and the patients response to treatment, I feel that patient is stable for discharge with diagnosis of asthma exacerbation secondary to the flu, extensive return precautions given..   emergency department workup does not suggest an emergent condition requiring admission or immediate intervention beyond what has been performed at this time. The plan is: as above. The patient is safe for discharge and has been instructed to return immediately for worsening symptoms, change in symptoms or any other concerns.  Final Clinical Impression(s) / ED Diagnoses Final diagnoses:  Flu  Moderate persistent asthma with exacerbation    Rx / DC Orders ED Discharge Orders     None         [place] 06/24/22 1110    Benjiman Core, MD 06/25/22 [phone]

## 2022-06-24 NOTE — Discharge Instructions (Addendum)
You have the flu this is a viral infection that will likely start to improve after 7-10 days, antibiotics are not helpful in treating viral infections. Since your symptoms have been present for more than 2 days Tamiflu will give no additional benefit.  Please make sure you are drinking plenty of fluids. You can treat your symptoms supportively with tylenol 650 mg/1000mg and ibuprofen 600 mg every 6 hours for fevers and pains. For nasal congestion you can use Zyrtec and Flonase to help with nasal congestion. To treat cough you can use over the counter cough medications such as Mucinex DM or Robitussin and throat lozenges. If your symptoms are not improving please follow up with you Primary doctor.   If you develop persistent fevers, shortness of breath or difficulty breathing, chest pain, severe headache and neck pain, persistent nausea and vomiting or other new or concerning symptoms return to the Emergency department.
